# Patient Record
Sex: Female | Born: 1945 | Race: White | Hispanic: No | Smoking: Former smoker
Health system: Southern US, Community
[De-identification: ages and names within clinical notes are randomized; demographics above are authoritative.]

## PROBLEM LIST (undated history)

## (undated) DIAGNOSIS — R011 Cardiac murmur, unspecified: Secondary | ICD-10-CM

## (undated) DIAGNOSIS — I10 Essential (primary) hypertension: Secondary | ICD-10-CM

## (undated) DIAGNOSIS — I6529 Occlusion and stenosis of unspecified carotid artery: Secondary | ICD-10-CM

## (undated) DIAGNOSIS — I639 Cerebral infarction, unspecified: Secondary | ICD-10-CM

## (undated) DIAGNOSIS — Z87891 Personal history of nicotine dependence: Secondary | ICD-10-CM

## (undated) HISTORY — PX: TUBAL LIGATION: SHX77

## (undated) HISTORY — DX: Personal history of nicotine dependence: Z87.891

## (undated) HISTORY — DX: Essential (primary) hypertension: I10

## (undated) HISTORY — DX: Cerebral infarction, unspecified: I63.9

## (undated) HISTORY — DX: Cardiac murmur, unspecified: R01.1

## (undated) HISTORY — DX: Occlusion and stenosis of unspecified carotid artery: I65.29

---

## 2005-12-14 DIAGNOSIS — R011 Cardiac murmur, unspecified: Secondary | ICD-10-CM

## 2005-12-14 HISTORY — DX: Cardiac murmur, unspecified: R01.1

## 2006-03-21 ENCOUNTER — Other Ambulatory Visit: Payer: Self-pay

## 2006-03-21 ENCOUNTER — Inpatient Hospital Stay: Payer: Self-pay | Admitting: Internal Medicine

## 2006-03-25 ENCOUNTER — Ambulatory Visit: Payer: Self-pay | Admitting: Physical Medicine & Rehabilitation

## 2006-03-25 ENCOUNTER — Inpatient Hospital Stay (HOSPITAL_COMMUNITY)
Admission: RE | Admit: 2006-03-25 | Discharge: 2006-04-09 | Payer: Self-pay | Admitting: Physical Medicine & Rehabilitation

## 2006-05-12 ENCOUNTER — Encounter: Payer: Self-pay | Admitting: Physical Medicine & Rehabilitation

## 2006-05-14 ENCOUNTER — Encounter: Payer: Self-pay | Admitting: Physical Medicine & Rehabilitation

## 2006-05-17 ENCOUNTER — Ambulatory Visit: Payer: Self-pay | Admitting: Physical Medicine & Rehabilitation

## 2006-05-17 ENCOUNTER — Encounter
Admission: RE | Admit: 2006-05-17 | Discharge: 2006-08-15 | Payer: Self-pay | Admitting: Physical Medicine & Rehabilitation

## 2006-05-25 ENCOUNTER — Ambulatory Visit: Payer: Self-pay | Admitting: Family Medicine

## 2006-06-09 ENCOUNTER — Ambulatory Visit: Payer: Self-pay | Admitting: Family Medicine

## 2006-12-14 DIAGNOSIS — I10 Essential (primary) hypertension: Secondary | ICD-10-CM

## 2006-12-14 HISTORY — DX: Essential (primary) hypertension: I10

## 2007-06-01 ENCOUNTER — Ambulatory Visit: Payer: Self-pay | Admitting: Family Medicine

## 2008-07-18 ENCOUNTER — Ambulatory Visit: Payer: Self-pay | Admitting: Family Medicine

## 2009-07-22 ENCOUNTER — Ambulatory Visit: Payer: Self-pay | Admitting: Family Medicine

## 2010-07-29 ENCOUNTER — Ambulatory Visit: Payer: Self-pay | Admitting: Internal Medicine

## 2010-08-05 ENCOUNTER — Ambulatory Visit: Payer: Self-pay | Admitting: Internal Medicine

## 2011-02-09 ENCOUNTER — Ambulatory Visit: Payer: Self-pay | Admitting: Internal Medicine

## 2011-04-29 ENCOUNTER — Ambulatory Visit: Payer: Self-pay | Admitting: General Surgery

## 2011-09-17 ENCOUNTER — Ambulatory Visit: Payer: Self-pay | Admitting: Internal Medicine

## 2012-09-19 ENCOUNTER — Ambulatory Visit: Payer: Self-pay | Admitting: Internal Medicine

## 2013-06-09 ENCOUNTER — Encounter: Payer: Self-pay | Admitting: *Deleted

## 2013-06-09 DIAGNOSIS — I6529 Occlusion and stenosis of unspecified carotid artery: Secondary | ICD-10-CM | POA: Insufficient documentation

## 2013-09-20 ENCOUNTER — Ambulatory Visit: Payer: Self-pay | Admitting: Internal Medicine

## 2013-11-22 ENCOUNTER — Ambulatory Visit: Payer: Self-pay | Admitting: General Surgery

## 2013-12-27 ENCOUNTER — Ambulatory Visit (INDEPENDENT_AMBULATORY_CARE_PROVIDER_SITE_OTHER): Payer: Medicare Other | Admitting: General Surgery

## 2013-12-27 ENCOUNTER — Other Ambulatory Visit

## 2013-12-27 ENCOUNTER — Encounter: Payer: Self-pay | Admitting: General Surgery

## 2013-12-27 VITALS — BP 124/58 | HR 70 | Resp 12 | Ht <= 58 in | Wt 106.0 lb

## 2013-12-27 DIAGNOSIS — I6529 Occlusion and stenosis of unspecified carotid artery: Secondary | ICD-10-CM

## 2013-12-27 NOTE — Progress Notes (Signed)
Patient ID: Jenna BullaLinda R Lamb, female   DOB: [birth year], 68 y.o.   MRN: [id number]  Chief Complaint  Patient presents with  . Follow-up    Carotid Ultrasound    HPI Jenna BullaLinda R Lamb is a 68 y.o. female here for a one year follow up for Carotid ultrasound. Sates she is getting along good, no new complaints.    HPI  Past Medical History  Diagnosis Date  . Hypertension 2008  . Personal history of tobacco use, presenting hazards to health   . Occlusion and stenosis of carotid artery without mention of cerebral infarction   . Heart murmur 2007  . Stroke     Past Surgical History  Procedure Laterality Date  . Tubal ligation      History reviewed. No pertinent family history.  Social History History  Substance Use Topics  . Smoking status: Former Smoker -- 1.00 packs/day for 20 years    Types: Cigarettes    Quit date: 12/14/1981  . Smokeless tobacco: Never Used  . Alcohol Use: No    No Known Allergies  Current Outpatient Prescriptions  Medication Sig Dispense Refill  . carvedilol (COREG) 6.25 MG tablet Take 6.25 mg by mouth daily.      . Cholecalciferol (VITAMIN D PO) Take 1,000 mg by mouth daily.      Marland Kitchen. dipyridamole-aspirin (AGGRENOX) 200-25 MG per 12 hr capsule Take 1 capsule by mouth 2 (two) times daily.      . rosuvastatin (CRESTOR) 20 MG tablet Take 20 mg by mouth daily.       No current facility-administered medications for this visit.    Review of Systems Review of Systems  Constitutional: Negative.   Respiratory: Negative.   Cardiovascular: Negative.     Blood pressure 124/58, pulse 70, resp. rate 12, height 4\' 9"  (1.448 m), weight 106 lb (48.081 kg).  Physical Exam Physical Exam  Data Reviewed Prior carotid doppler reports  Assessment    Mild carotid artery disease, no hemodynamically significant stenosis. See report of Duplex study.    Plan    Recheck in 1 yr.        Stevin Bielinski G 12/29/2013, 8:13 AM

## 2013-12-27 NOTE — Patient Instructions (Signed)
The patient is aware to call back for any questions or concerns.  

## 2013-12-29 ENCOUNTER — Encounter: Payer: Self-pay | Admitting: General Surgery

## 2014-08-27 DIAGNOSIS — I10 Essential (primary) hypertension: Secondary | ICD-10-CM | POA: Insufficient documentation

## 2014-10-15 ENCOUNTER — Encounter: Payer: Self-pay | Admitting: General Surgery

## 2014-11-20 ENCOUNTER — Ambulatory Visit: Payer: Self-pay | Admitting: Family Medicine

## 2015-01-07 ENCOUNTER — Ambulatory Visit: Admitting: General Surgery

## 2015-01-15 ENCOUNTER — Ambulatory Visit: Payer: Medicare Other | Admitting: General Surgery

## 2015-02-12 ENCOUNTER — Encounter: Payer: Self-pay | Admitting: *Deleted

## 2016-03-03 ENCOUNTER — Other Ambulatory Visit: Payer: Self-pay | Admitting: Family Medicine

## 2016-03-03 DIAGNOSIS — Z1231 Encounter for screening mammogram for malignant neoplasm of breast: Secondary | ICD-10-CM

## 2016-03-16 ENCOUNTER — Ambulatory Visit
Admission: RE | Admit: 2016-03-16 | Discharge: 2016-03-16 | Disposition: A | Discharge status: Home / Self Care | Payer: Medicare Other | Source: Ambulatory Visit | Attending: Family Medicine | Admitting: Family Medicine

## 2016-03-16 ENCOUNTER — Other Ambulatory Visit: Payer: Self-pay | Admitting: Family Medicine

## 2016-03-16 DIAGNOSIS — Z1231 Encounter for screening mammogram for malignant neoplasm of breast: Secondary | ICD-10-CM | POA: Diagnosis present

## 2016-03-19 ENCOUNTER — Other Ambulatory Visit: Payer: Self-pay | Admitting: Family Medicine

## 2016-03-19 DIAGNOSIS — R928 Other abnormal and inconclusive findings on diagnostic imaging of breast: Secondary | ICD-10-CM

## 2016-03-30 ENCOUNTER — Ambulatory Visit
Admission: RE | Admit: 2016-03-30 | Discharge: 2016-03-30 | Disposition: A | Discharge status: Home / Self Care | Payer: Medicare Other | Source: Ambulatory Visit | Attending: Family Medicine | Admitting: Family Medicine

## 2016-03-30 DIAGNOSIS — R928 Other abnormal and inconclusive findings on diagnostic imaging of breast: Secondary | ICD-10-CM | POA: Insufficient documentation

## 2017-06-01 ENCOUNTER — Other Ambulatory Visit: Payer: Self-pay | Admitting: Family Medicine

## 2017-06-01 DIAGNOSIS — Z1231 Encounter for screening mammogram for malignant neoplasm of breast: Secondary | ICD-10-CM

## 2017-06-21 ENCOUNTER — Ambulatory Visit
Admission: RE | Admit: 2017-06-21 | Discharge: 2017-06-21 | Disposition: A | Discharge status: Home / Self Care | Payer: Medicare Other | Source: Ambulatory Visit | Attending: Family Medicine | Admitting: Family Medicine

## 2017-06-21 DIAGNOSIS — Z1231 Encounter for screening mammogram for malignant neoplasm of breast: Secondary | ICD-10-CM | POA: Diagnosis present

## 2018-05-16 ENCOUNTER — Other Ambulatory Visit: Payer: Self-pay | Admitting: Family Medicine

## 2018-05-16 DIAGNOSIS — Z1231 Encounter for screening mammogram for malignant neoplasm of breast: Secondary | ICD-10-CM

## 2018-06-22 ENCOUNTER — Ambulatory Visit
Admission: RE | Admit: 2018-06-22 | Discharge: 2018-06-22 | Disposition: A | Discharge status: Home / Self Care | Payer: Medicare Other | Source: Ambulatory Visit | Attending: Family Medicine | Admitting: Family Medicine

## 2018-06-22 DIAGNOSIS — Z1231 Encounter for screening mammogram for malignant neoplasm of breast: Secondary | ICD-10-CM | POA: Insufficient documentation

## 2019-05-22 ENCOUNTER — Other Ambulatory Visit: Payer: Self-pay | Admitting: Family Medicine

## 2019-05-22 DIAGNOSIS — Z1231 Encounter for screening mammogram for malignant neoplasm of breast: Secondary | ICD-10-CM

## 2019-07-04 ENCOUNTER — Ambulatory Visit
Admission: RE | Admit: 2019-07-04 | Discharge: 2019-07-04 | Disposition: A | Discharge status: Home / Self Care | Payer: Medicare Other | Source: Ambulatory Visit | Attending: Family Medicine | Admitting: Family Medicine

## 2019-07-04 DIAGNOSIS — Z1231 Encounter for screening mammogram for malignant neoplasm of breast: Secondary | ICD-10-CM

## 2020-05-27 ENCOUNTER — Other Ambulatory Visit: Payer: Self-pay | Admitting: Family Medicine

## 2020-05-27 DIAGNOSIS — Z1231 Encounter for screening mammogram for malignant neoplasm of breast: Secondary | ICD-10-CM

## 2020-07-04 ENCOUNTER — Ambulatory Visit
Admission: RE | Admit: 2020-07-04 | Discharge: 2020-07-04 | Disposition: A | Discharge status: Home / Self Care | Payer: Medicare Other | Source: Ambulatory Visit | Attending: Family Medicine | Admitting: Family Medicine

## 2020-07-04 DIAGNOSIS — Z1231 Encounter for screening mammogram for malignant neoplasm of breast: Secondary | ICD-10-CM | POA: Insufficient documentation

## 2021-06-03 ENCOUNTER — Other Ambulatory Visit: Payer: Self-pay | Admitting: Family Medicine

## 2021-06-03 DIAGNOSIS — Z1231 Encounter for screening mammogram for malignant neoplasm of breast: Secondary | ICD-10-CM

## 2021-07-07 ENCOUNTER — Ambulatory Visit
Admission: RE | Admit: 2021-07-07 | Discharge: 2021-07-07 | Disposition: A | Discharge status: Home / Self Care | Payer: Medicare Other | Source: Ambulatory Visit | Attending: Family Medicine | Admitting: Family Medicine

## 2021-07-07 ENCOUNTER — Other Ambulatory Visit: Payer: Self-pay

## 2021-07-07 DIAGNOSIS — Z1231 Encounter for screening mammogram for malignant neoplasm of breast: Secondary | ICD-10-CM | POA: Diagnosis not present

## 2022-06-10 ENCOUNTER — Other Ambulatory Visit: Payer: Self-pay | Admitting: Family Medicine

## 2022-06-10 DIAGNOSIS — Z1231 Encounter for screening mammogram for malignant neoplasm of breast: Secondary | ICD-10-CM

## 2022-07-09 ENCOUNTER — Ambulatory Visit
Admission: RE | Admit: 2022-07-09 | Discharge: 2022-07-09 | Disposition: A | Discharge status: Home / Self Care | Payer: Medicare Other | Source: Ambulatory Visit | Attending: Family Medicine | Admitting: Family Medicine

## 2022-07-09 DIAGNOSIS — Z1231 Encounter for screening mammogram for malignant neoplasm of breast: Secondary | ICD-10-CM | POA: Diagnosis not present

## 2023-06-09 ENCOUNTER — Encounter: Payer: Self-pay | Admitting: Family Medicine

## 2023-06-23 ENCOUNTER — Other Ambulatory Visit: Payer: Self-pay | Admitting: Family Medicine

## 2023-06-23 DIAGNOSIS — Z1231 Encounter for screening mammogram for malignant neoplasm of breast: Secondary | ICD-10-CM

## 2023-06-24 IMAGING — MG MM DIGITAL SCREENING BILAT W/ TOMO AND CAD
6 of 10 series · 6 of 30 positions shown · non-contrast
Comparison: Previous exam(s).

CLINICAL DATA: Screening.

EXAM:
DIGITAL SCREENING BILATERAL MAMMOGRAM WITH TOMOSYNTHESIS AND CAD
TECHNIQUE: Bilateral screening digital craniocaudal and mediolateral oblique
mammograms were obtained. Bilateral screening digital breast
tomosynthesis was performed. The images were evaluated with
computer-aided detection.

[R CC synth-2D]
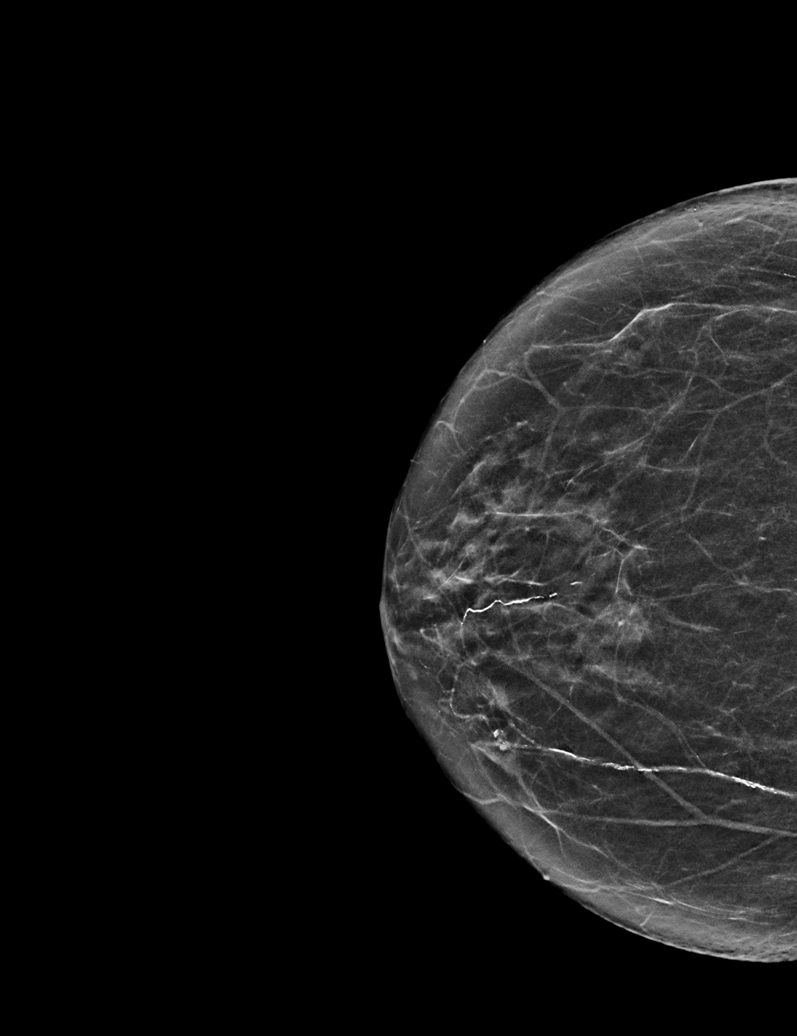

[R MLO synth-2D (1 of 2)]
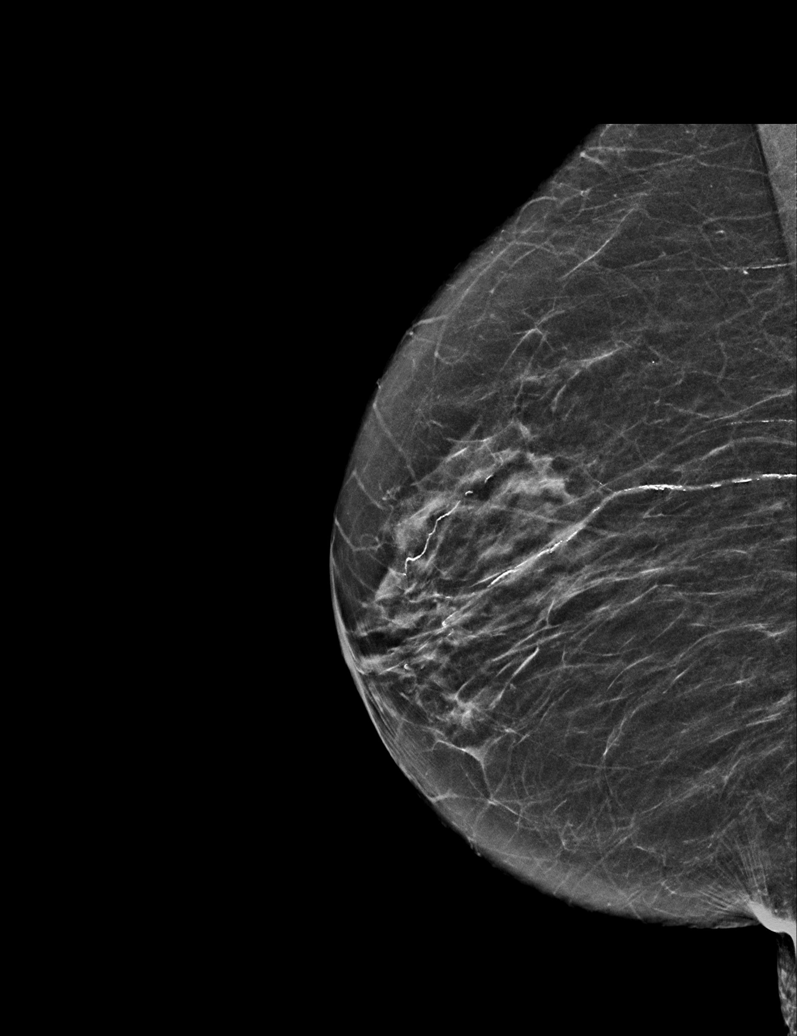

[R MLO synth-2D (2 of 2)]
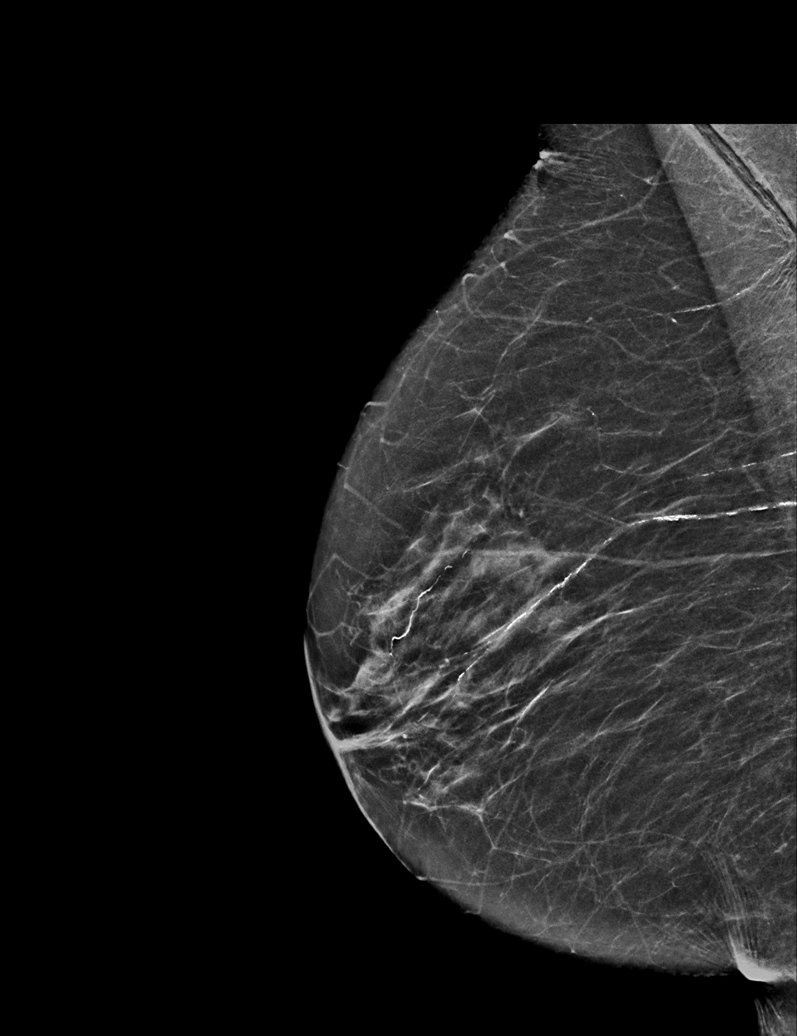

[L CC synth-2D]
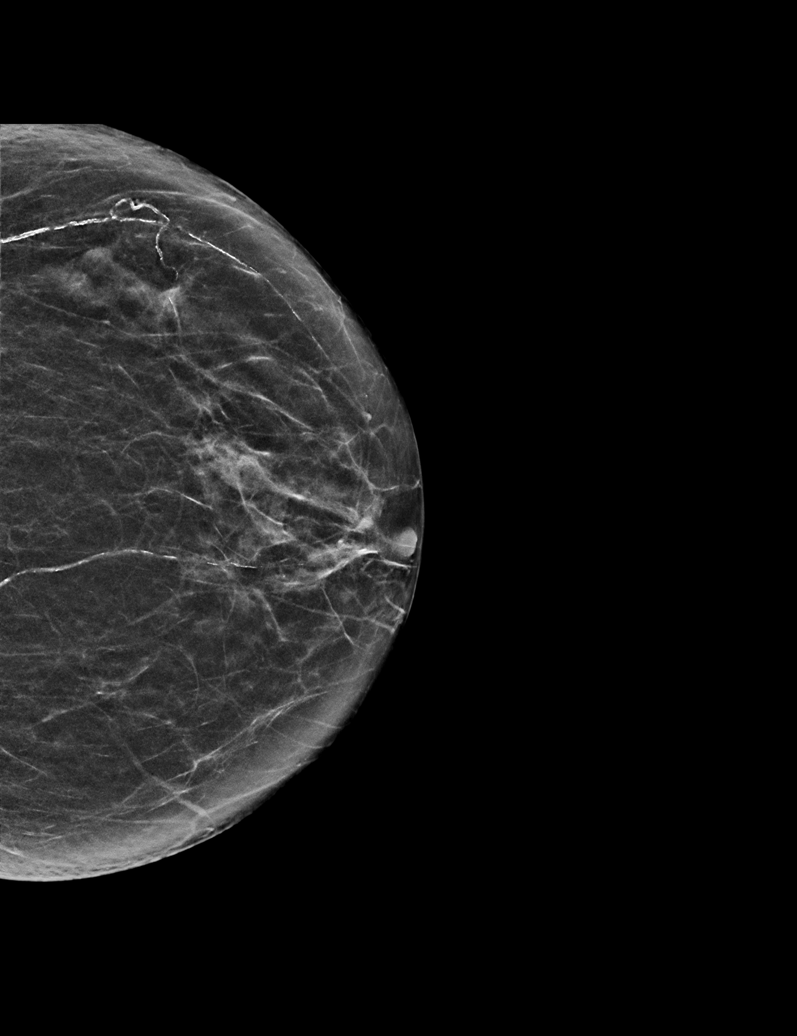

[L MLO synth-2D]
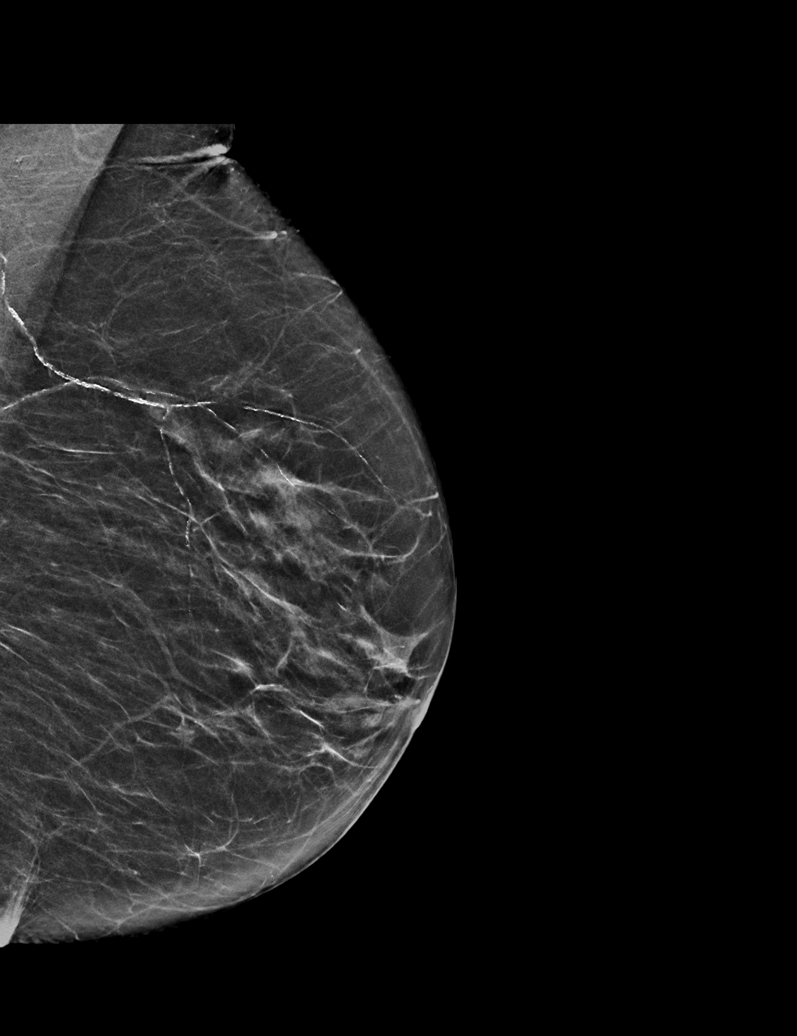

[R CC tomo · tomo slice 24/47.0]
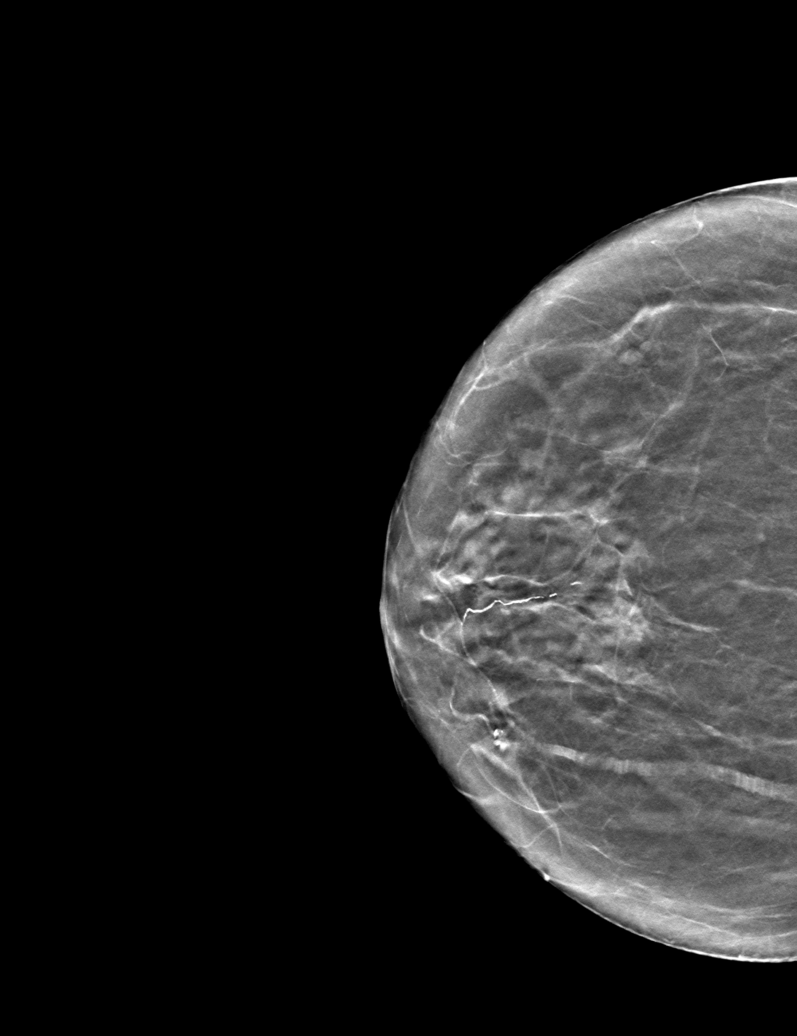

[6 of 30 positions shown; findings below may reference images not displayed]

ACR Breast Density Category b: There are scattered areas of
fibroglandular density.
FINDINGS: There are no findings suspicious for malignancy.
IMPRESSION: No mammographic evidence of malignancy. A result letter of this
screening mammogram will be mailed directly to the patient.

RECOMMENDATION:
Screening mammogram in one year. (Code:51-O-LD2)

BI-RADS CATEGORY  1: Negative.

## 2023-07-13 ENCOUNTER — Ambulatory Visit
Admission: RE | Admit: 2023-07-13 | Discharge: 2023-07-13 | Disposition: A | Discharge status: Home / Self Care | Payer: Medicare Other | Source: Ambulatory Visit | Attending: Family Medicine | Admitting: Family Medicine

## 2023-07-13 DIAGNOSIS — Z1231 Encounter for screening mammogram for malignant neoplasm of breast: Secondary | ICD-10-CM | POA: Insufficient documentation

## 2024-06-12 ENCOUNTER — Other Ambulatory Visit: Payer: Self-pay | Admitting: Family Medicine

## 2024-06-12 DIAGNOSIS — Z1231 Encounter for screening mammogram for malignant neoplasm of breast: Secondary | ICD-10-CM

## 2024-07-13 ENCOUNTER — Encounter

## 2024-08-02 ENCOUNTER — Ambulatory Visit
Admission: RE | Admit: 2024-08-02 | Discharge: 2024-08-02 | Disposition: A | Discharge status: Home / Self Care | Source: Ambulatory Visit | Attending: Family Medicine | Admitting: Family Medicine

## 2024-08-02 DIAGNOSIS — Z1231 Encounter for screening mammogram for malignant neoplasm of breast: Secondary | ICD-10-CM | POA: Insufficient documentation

## 2024-08-17 ENCOUNTER — Ambulatory Visit (INDEPENDENT_AMBULATORY_CARE_PROVIDER_SITE_OTHER): Admitting: Nurse Practitioner

## 2024-08-17 ENCOUNTER — Encounter (INDEPENDENT_AMBULATORY_CARE_PROVIDER_SITE_OTHER): Payer: Self-pay | Admitting: Nurse Practitioner

## 2024-08-17 VITALS — BP 183/59 | HR 89 | Ht <= 58 in | Wt 98.0 lb

## 2024-08-17 DIAGNOSIS — I6523 Occlusion and stenosis of bilateral carotid arteries: Secondary | ICD-10-CM

## 2024-08-17 DIAGNOSIS — M5430 Sciatica, unspecified side: Secondary | ICD-10-CM

## 2024-08-17 DIAGNOSIS — I10 Essential (primary) hypertension: Secondary | ICD-10-CM

## 2024-08-20 ENCOUNTER — Encounter (INDEPENDENT_AMBULATORY_CARE_PROVIDER_SITE_OTHER): Payer: Self-pay | Admitting: Nurse Practitioner

## 2024-08-20 NOTE — Progress Notes (Signed)
 Subjective:    Patient ID: Jenna Lamb, female    DOB: Dec 09, 1946, 78 y.o.   MRN: [id number] Chief Complaint  Patient presents with   New Patient (Initial Visit)    np. consult. bilateral carotid stenosis. US  in care everywhere. babaoff, marcus.     The patient presents today for evaluation of carotid artery disease.  This was noted after a carotid bruit was heard at her follow-up evaluation at her PCP office.  She underwent a carotid artery duplex that showed 50 to 69% stenosis in the bilateral internal carotid arteries.  She had higher velocities in the right external carotid artery.  Overall she currently does not have any signs symptoms of TIA or CVA.  She had a previous CVA about 18 years ago.  Her largest issue today is of significant sciatic pain that had a sudden onset for her.  She also notes that she had some some bladder incontinence with this.      Review of Systems  Musculoskeletal:  Positive for arthralgias and back pain.  All other systems reviewed and are negative.      Objective:   Physical Exam Vitals reviewed.  HENT:     Head: Normocephalic.  Neck:     Vascular: Carotid bruit present.  Cardiovascular:     Rate and Rhythm: Normal rate.  Pulmonary:     Effort: Pulmonary effort is normal.  Skin:    General: Skin is warm and dry.  Neurological:     Mental Status: She is alert and oriented to person, place, and time.     Gait: Gait abnormal.  Psychiatric:        Mood and Affect: Mood normal.        Behavior: Behavior normal.        Thought Content: Thought content normal.        Judgment: Judgment normal.     BP (!) 183/59   Pulse 89   Ht 4' 9 (1.448 m)   Wt 98 lb (44.5 kg)   BMI 21.21 kg/m   Past Medical History:  Diagnosis Date   Heart murmur 2007   Hypertension 2008   Occlusion and stenosis of carotid artery without mention of cerebral infarction    Personal history of tobacco use, presenting hazards to health    Stroke [facility])      Social History   Socioeconomic History   Marital status: Widowed    Spouse name: Not on file   Number of children: Not on file   Years of education: Not on file   Highest education level: Not on file  Occupational History   Not on file  Tobacco Use   Smoking status: Former    Current packs/day: 0.00    Average packs/day: 1 pack/day for 20.0 years (20.0 ttl pk-yrs)    Types: Cigarettes    Start date: 12/14/1961    Quit date: 12/14/1981    Years since quitting: 42.7   Smokeless tobacco: Never  Substance and Sexual Activity   Alcohol use: No   Drug use: No   Sexual activity: Not on file  Other Topics Concern   Not on file  Social History Narrative   Not on file   Social Drivers of Health   Financial Resource Strain: Low Risk  (07/07/2024)   Received from [facility]   Overall Financial Resource Strain (CARDIA)    Difficulty of Paying Living Expenses: Not hard at all  Food Insecurity: No Food Insecurity (  07/07/2024)   Received from [facility]   Hunger Vital Sign    Within the past 12 months, you worried that your food would run out before you got the money to buy more.: Never true    Within the past 12 months, the food you bought just didn't last and you didn't have money to get more.: Never true  Transportation Needs: No Transportation Needs (07/07/2024)   Received from [facility] - Transportation    In the past 12 months, has lack of transportation kept you from medical appointments or from getting medications?: No    Lack of Transportation (Non-Medical): No  Physical Activity: Not on file  Stress: Not on file  Social Connections: Not on file  Intimate Partner Violence: Not on file    Past Surgical History:  Procedure Laterality Date   TUBAL LIGATION      Family History  Problem Relation Age of Onset   Breast cancer Neg Hx     No Known Allergies      No data to display             CMP  No results found for: NA, K, CL, CO2, GLUCOSE, BUN, CREATININE, CALCIUM, PROT, ALBUMIN, AST, ALT, ALKPHOS, BILITOT, GFR, EGFR, GFRNONAA   No results found.     Assessment & Plan:   1. Bilateral carotid artery stenosis (Primary) I had a long discussion with the patient regarding her carotid ultrasound results.  She has a 50 to 69% stenosis bilaterally.  Based on the velocities this usually will be followed annually.  However because this was our first look we typically will reevaluate in 6 months.  She does have some higher velocities in the external carotid artery but typically these are not intervened upon unless there is occlusion of the internal carotid arteries and there is concern that they may be an embolic source.  To treat her external carotid artery stenosis she is currently on a statin with antiplatelet.  At this time no medication changes but again we will have her back in 6 months to repeat studies.  2. Benign essential hypertension Continue antihypertensive medications as already ordered, these medications have been reviewed and there are no changes at this time.  3. Sciatica, unspecified laterality The patient has had a sudden onset of sciatica which is extreme for her.  She has also noted some bowel incontinence.  Based on this we will send her to neurosurgery for further evaluation   Current Outpatient Medications on File Prior to Visit  Medication Sig Dispense Refill   carvedilol (COREG) 6.25 MG tablet Take 6.25 mg by mouth daily.     Cholecalciferol (VITAMIN D PO) Take 1,000 mg by mouth daily.     dipyridamole-aspirin (AGGRENOX) 200-25 MG per 12 hr capsule Take 1 capsule by mouth 2 (two) times daily.     rosuvastatin (CRESTOR) 20 MG tablet Take 20 mg by mouth daily.     No current facility-administered medications on file prior to visit.    There are no Patient Instructions on file for this visit. No follow-ups on  file.   Hobart Marte E Treonna Klee, NP

## 2024-09-15 NOTE — Progress Notes (Unsigned)
 Referring Physician:  Diedra Lame, MD [phone] [address] [facility] [place],  [place] [address]  Primary Physician:  Diedra Lame, MD  History of Present Illness: 09/19/24 Ms. Ruhama Lehew is here today with a chief complaint of back pain that started 7 weeks ago that was extending down both legs down the back to the level of her knee.  However now it has improved quite a bit.  She has noticed she has had some trouble walking.  Denies any neck pain or radiation down her arms.  No new saddle anesthesia or incontinence of bowel or bladder.    Weakness: none Bowel/Bladder Dysfunction: none  Conservative measures:  Physical therapy: Has not participated in. Multimodal medical therapy including regular antiinflammatories:  Injections:  No epidural steroid injections.    The symptoms are causing a significant impact on the patient's life.   Review of Systems:  A 10 point review of systems is negative, except for the pertinent positives and negatives detailed in the HPI.  Past Medical History: Past Medical History:  Diagnosis Date   Heart murmur 2007   Hypertension 2008   Occlusion and stenosis of carotid artery without mention of cerebral infarction    Personal history of tobacco use, presenting hazards to health    Stroke [facility])     Past Surgical History: Past Surgical History:  Procedure Laterality Date   TUBAL LIGATION      Allergies: Allergies as of 09/19/2024   (No Known Allergies)    Medications: Outpatient Encounter Medications as of 09/19/2024  Medication Sig   carvedilol (COREG) 6.25 MG tablet Take 6.25 mg by mouth daily.   Cholecalciferol (VITAMIN D PO) Take 1,000 mg by mouth daily.   dipyridamole-aspirin (AGGRENOX) 200-25 MG per 12 hr capsule Take 1 capsule by mouth 2 (two) times daily.   rosuvastatin (CRESTOR) 20 MG tablet Take 20 mg by mouth daily.   No facility-administered encounter medications on file as of  09/19/2024.    Social History: Social History   Tobacco Use   Smoking status: Former    Current packs/day: 0.00    Average packs/day: 1 pack/day for 20.0 years (20.0 ttl pk-yrs)    Types: Cigarettes    Start date: 12/14/1961    Quit date: 12/14/1981    Years since quitting: 42.8   Smokeless tobacco: Never  Substance Use Topics   Alcohol use: No   Drug use: No    Family Medical History: Family History  Problem Relation Age of Onset   Breast cancer Neg Hx     Physical Examination: @VITALWITHPAIN @  General: Patient is well developed, well nourished, calm, collected, and in no apparent distress. Attention to examination is appropriate.  Psychiatric: Patient is non-anxious.  Head:  Pupils equal, round, and reactive to light.  ENT:  Oral mucosa appears well hydrated.  Neck:   Supple.  Full range of motion.  Respiratory: Patient is breathing without any difficulty.  Extremities: No edema.  Vascular: Palpable dorsal pedal pulses.  Skin:   On exposed skin, there are no abnormal skin lesions.  NEUROLOGICAL:     Awake, alert, oriented to person, place, and time.  Speech is clear and fluent. Fund of knowledge is appropriate.   Cranial Nerves: Pupils equal round and reactive to light.  Facial tone is symmetric.  ROM of spine: Minimal tenderness palpation of lumbar paraspinals  Strength: Side Biceps Triceps Deltoid Interossei Grip Wrist Ext. Wrist Flex.  R 5 5 5  5 5 5 5   L 5 5 5 5 5 5 5    Side Iliopsoas Quads Hamstring PF DF EHL  R 4+ 5 5 5 5 5   L 4+ 5 4 5 5 5   3+ patella, hamstring, 1+ r achilles, trace achilles.   +hoffman 3+ bilateral upper, 2+ beats on clonus    Patient does not walk with tandem gait.  Medical Decision Making  Imaging: No recent imaging to review  Assessment and Plan: Ms. Arcos is a pleasant 78 y.o. female with improved lumbar radiculopathy after having acute pain approximately 7 weeks ago.  As stated, this is improved quite a bit.  She  still has some radiating pain down the back of her legs.  Came today primarily for a physical therapy referral.  However on examination patient is hyperreflexic.  She is not able to walk with a tandem gait.  Concern for myelopathy.  I discussed this with the patient and her daughter.  My formal recommendation would be to have an MRI of the spine to evaluate this further however they would not like to move forward with that at this time.  I explained the risks of this and that this could contribute to her fall risk as she is not able to walk with a tandem gait.  They understood.  Plan to lease get x-rays today while in clinic.  Will hold on MRI as patient requested for now.  Physical therapy referral has been sent.    Thank you for involving me in the care of this patient.    Lyle Decamp, PA-C Dept. of Neurosurgery

## 2024-09-19 ENCOUNTER — Encounter: Payer: Self-pay | Admitting: Physician Assistant

## 2024-09-19 ENCOUNTER — Ambulatory Visit

## 2024-09-19 ENCOUNTER — Ambulatory Visit (INDEPENDENT_AMBULATORY_CARE_PROVIDER_SITE_OTHER): Admitting: Physician Assistant

## 2024-09-19 VITALS — BP 120/70 | Ht <= 58 in | Wt 89.0 lb

## 2024-09-19 DIAGNOSIS — G959 Disease of spinal cord, unspecified: Secondary | ICD-10-CM | POA: Diagnosis not present

## 2024-09-19 DIAGNOSIS — M545 Low back pain, unspecified: Secondary | ICD-10-CM

## 2024-09-19 DIAGNOSIS — M5416 Radiculopathy, lumbar region: Secondary | ICD-10-CM

## 2024-09-19 DIAGNOSIS — R2681 Unsteadiness on feet: Secondary | ICD-10-CM | POA: Diagnosis not present

## 2024-09-19 DIAGNOSIS — R292 Abnormal reflex: Secondary | ICD-10-CM

## 2025-02-12 ENCOUNTER — Ambulatory Visit (INDEPENDENT_AMBULATORY_CARE_PROVIDER_SITE_OTHER): Admitting: Vascular Surgery

## 2025-02-12 ENCOUNTER — Encounter (INDEPENDENT_AMBULATORY_CARE_PROVIDER_SITE_OTHER)
